# Patient Record
Sex: Male | Born: 1990 | Race: White | Hispanic: No | Marital: Married | Smoking: Current every day smoker
Health system: Southern US, Community
[De-identification: ages and names within clinical notes are randomized; demographics above are authoritative.]

---
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
## 2009-04-18 ENCOUNTER — Emergency Department (HOSPITAL_COMMUNITY): Admission: EM | Admit: 2009-04-18 | Discharge: 2009-04-18 | Payer: Self-pay | Admitting: Emergency Medicine

## 2013-12-30 ENCOUNTER — Emergency Department (HOSPITAL_COMMUNITY)
Admission: EM | Admit: 2013-12-30 | Discharge: 2013-12-30 | Disposition: A | Discharge status: Home / Self Care | Payer: Self-pay | Attending: Emergency Medicine | Admitting: Emergency Medicine

## 2013-12-30 ENCOUNTER — Encounter (HOSPITAL_COMMUNITY): Payer: Self-pay | Admitting: Emergency Medicine

## 2013-12-30 DIAGNOSIS — B029 Zoster without complications: Secondary | ICD-10-CM | POA: Insufficient documentation

## 2013-12-30 DIAGNOSIS — F17200 Nicotine dependence, unspecified, uncomplicated: Secondary | ICD-10-CM | POA: Insufficient documentation

## 2013-12-30 MED ORDER — HYDROCODONE-ACETAMINOPHEN 5-325 MG PO TABS: 1.0000 | ORAL_TABLET | Freq: Four times a day (QID) | ORAL | Status: AC | PRN

## 2013-12-30 NOTE — ED Notes (Signed)
The pt has a rash to the  Lt  Lower ribs anterior lateral.  The past 2 days the rash has become more painful.  He describes as a burning sensation

## 2013-12-30 NOTE — Discharge Instructions (Signed)
Shingles  Shingles is caused by the same virus that causes chickenpox. The first feelings may be pain or tingling. A rash will follow in a couple days. The rash may occur on any area of the body. Long-lasting pain is more likely in an elderly person. It can last months to years. There are medicines that can help prevent pain if you start taking them early.  HOME CARE    Place cool cloths on the rash.   Only take medicine as told by your doctor.   You may use calamine lotion to relieve itchy skin.   Avoid touching:   Babies.   Children with inflamed skin (eczema).   People who have gotten transplanted organs.   People with chronic illnesses, such as leukemia and AIDS.   If the rash is on the face, you may need to see a specialist. Keep all appointments. Shingles must be kept away from the eyes, if possible.   Keep all appointments.   Avoid touching the eyes or eye area, if possible.  GET HELP RIGHT AWAY IF:    You have any pain on the face or eye.   Your medicines do not help.   Your redness or puffiness (swelling) spreads.   You have a fever.   You notice any red lines going away from the rash area.  MAKE SURE YOU:    Understand these instructions.   Will watch your condition.   Will get help right away if you are not doing well or get worse.  Document Released: 03/26/2008 Document Revised: 12/31/2011 Document Reviewed: 03/26/2008  [employer] Patient Information 2014 [employer].

## 2013-12-30 NOTE — ED Notes (Signed)
PA at bedside.

## 2013-12-30 NOTE — ED Provider Notes (Signed)
CSN: [id number]     Arrival date & time 12/30/13  1836 History  This chart was scribed for non-physician practitioner, Rudene Anda, PA-C working with Nelia Shi, MD by Greggory Stallion, ED scribe. This patient was seen in room TR09C/TR09C and the patient's care was started at 9:40 PM.   Chief Complaint  Patient presents with  . Herpes Zoster   The history is provided by the patient. No language interpreter was used.   HPI Comments: Steven Morgan is a 23 y.o. male who presents to the Emergency Department complaining of a burning rash to his left flank area that started 1 wk ago. He states it has become more painful. Rates the pain 5/10. Laying on his side worsens the pain. He has used calamine lotion with little relief. Denies fever, chills. Pt thinks he had chicken pox when he was younger. Denies any spreading of rash, no fever/chills, n/v, HA, dizziness.   History reviewed. No pertinent past medical history. History reviewed. No pertinent past surgical history. No family history on file. History  Substance Use Topics  . Smoking status: Current Every Day Smoker  . Smokeless tobacco: Not on file  . Alcohol Use: Yes    Review of Systems  Constitutional: Negative for fever and chills.  All other systems reviewed and are negative.   Allergies  Review of patient's allergies indicates no known allergies.  Home Medications   Current Outpatient Rx  Name  Route  Sig  Dispense  Refill  . HYDROcodone-acetaminophen (NORCO) 5-325 MG per tablet   Oral   Take 1-2 tablets by mouth every 6 (six) hours as needed for moderate pain.   15 tablet   0     BP 127/76  Pulse 84  Temp(Src) 98.8 F (37.1 C) (Oral)  Resp 18  Ht 5\' 8"  (1.727 m)  Wt 137 lb (62.143 kg)  BMI 20.84 kg/m2  SpO2 97%  Physical Exam  Nursing note and vitals reviewed. Constitutional: He is oriented to person, place, and time. He appears well-developed and well-nourished. No distress.  HENT:  Head:  Normocephalic and atraumatic.  Eyes: Conjunctivae are normal.  Neck: No JVD present. No tracheal deviation present.  Cardiovascular: Normal rate and regular rhythm.  Exam reveals no gallop and no friction rub.   No murmur heard. Pulmonary/Chest: Effort normal. No respiratory distress. He has no wheezes. He has no rhonchi. He has no rales.  Musculoskeletal: Normal range of motion. He exhibits no edema.  Neurological: He is alert and oriented to person, place, and time.  Skin: Skin is warm and dry. He is not diaphoretic.     Unilateral erythematous patches with black crusting. No extending redness. Tenderness to palpation. Dermatomal distribution as depicted above in diagram. Does not cross midline. Suggestive of herpes zoster. No evidence of any new vesicular lesions. Appears to be resolving.   Psychiatric: He has a normal mood and affect. His behavior is normal.    ED Course  Procedures (including critical care time)  DIAGNOSTIC STUDIES: Oxygen Saturation is 97% on RA, normal by my interpretation.    COORDINATION OF CARE: 9:44 PM-Discussed treatment plan which includes pain medication with pt at bedside and pt agreed to plan.   Labs Review Labs Reviewed - No data to display Imaging Review No results found.   EKG Interpretation None      MDM   Final diagnoses:  Herpes zoster   Patient afebrile with normal VS. Patient in NAD.  Discussed exam findings  and pathology of shingles. Discussed treatment plan with analgesics and follow up if symptoms worsen or rash spreads. Patient is immunecompetent. Rash present > 72 hours, no indication for antivirals at this time. Discussed this with patient. Patient confirms understanding and agrees with current treatment plan for analgesics and follow up for any worsening symptoms.   Meds given in ED:  Discharge Meds:  . HYDROcodone-acetaminophen (NORCO) 5-325 MG per tablet   Oral   Take 1-2 tablets by mouth every 6 (six) hours as needed  for moderate pain.   15 tablet      I personally performed the services described in this documentation, which was scribed in my presence. The recorded information has been reviewed and is accurate.  Rudene AndaJacob Gray Amyah Clawson, PA-C 01/01/14 [phone]

## 2014-01-11 NOTE — ED Provider Notes (Signed)
Medical screening examination/treatment/procedure(s) were performed by non-physician practitioner and as supervising physician I was immediately available for consultation/collaboration.   Madilynn Montante L Kedra Mcglade, MD 01/11/14 1116 

## 2017-04-15 ENCOUNTER — Emergency Department (HOSPITAL_COMMUNITY)
Admission: EM | Admit: 2017-04-15 | Discharge: 2017-04-16 | Disposition: A | Discharge status: Home / Self Care | Payer: Self-pay | Attending: Emergency Medicine | Admitting: Emergency Medicine

## 2017-04-15 ENCOUNTER — Encounter (HOSPITAL_COMMUNITY): Payer: Self-pay | Admitting: Family Medicine

## 2017-04-15 DIAGNOSIS — F17210 Nicotine dependence, cigarettes, uncomplicated: Secondary | ICD-10-CM | POA: Insufficient documentation

## 2017-04-15 DIAGNOSIS — R1030 Lower abdominal pain, unspecified: Secondary | ICD-10-CM | POA: Insufficient documentation

## 2017-04-15 LAB — URINALYSIS, ROUTINE W REFLEX MICROSCOPIC
Bilirubin Urine: NEGATIVE
Glucose, UA: NEGATIVE mg/dL
HGB URINE DIPSTICK: NEGATIVE
Ketones, ur: NEGATIVE mg/dL
Leukocytes, UA: NEGATIVE
Nitrite: NEGATIVE
Protein, ur: NEGATIVE mg/dL
SPECIFIC GRAVITY, URINE: 1.018 (ref 1.005–1.030)
pH: 7 (ref 5.0–8.0)

## 2017-04-15 LAB — COMPREHENSIVE METABOLIC PANEL
ALT: 23 U/L (ref 17–63)
ANION GAP: 5 (ref 5–15)
AST: 24 U/L (ref 15–41)
Albumin: 4.7 g/dL (ref 3.5–5.0)
Alkaline Phosphatase: 96 U/L (ref 38–126)
BUN: 13 mg/dL (ref 6–20)
CHLORIDE: 105 mmol/L (ref 101–111)
CO2: 27 mmol/L (ref 22–32)
Calcium: 9 mg/dL (ref 8.9–10.3)
Creatinine, Ser: 0.84 mg/dL (ref 0.61–1.24)
GFR calc non Af Amer: >60 mL/min (ref >60)
Glucose, Bld: 126 mg/dL — ABNORMAL HIGH (ref 65–99)
Potassium: 4.3 mmol/L (ref 3.5–5.1)
Sodium: 137 mmol/L (ref 135–145)
Total Bilirubin: 0.4 mg/dL (ref 0.3–1.2)
Total Protein: 7.3 g/dL (ref 6.5–8.1)

## 2017-04-15 LAB — CBC
HCT: 42.3 % (ref 39.0–52.0)
Hemoglobin: 14.8 g/dL (ref 13.0–17.0)
MCH: 32.5 pg (ref 26.0–34.0)
MCHC: 35 g/dL (ref 30.0–36.0)
MCV: 92.8 fL (ref 78.0–100.0)
Platelets: 283 10*3/uL (ref 150–400)
RBC: 4.56 MIL/uL (ref 4.22–5.81)
RDW: 13.3 % (ref 11.5–15.5)
WBC: 10.2 10*3/uL (ref 4.0–10.5)

## 2017-04-15 LAB — LIPASE, BLOOD: LIPASE: 19 U/L (ref 11–51)

## 2017-04-15 NOTE — ED Provider Notes (Signed)
[facility]-EMERGENCY DEPT Provider Note   CSN: [id number] Arrival date & time: 04/15/17  1733  By signing my name below, I, Phillips ClimesFabiola de Louis, attest that this documentation has been prepared under the direction and in the presence of Romel Dumond, Canary BrimChristopher J, * . Electronically Signed: Phillips ClimesFabiola de Louis, Scribe. 04/16/2017. 1:56 AM.  History   Chief Complaint Chief Complaint  Patient presents with  . Abdominal Pain   HPI Comments Steven Morgan is a 26 y.o. male with a reported PMHx of kidney stones, who presents to the Emergency Department with complaints of lower back and abdominal pain x1 yr, worse in the last day.  Pt feels as if he has a kidney stone.  Pain is worse when pt drinks fluids, currently rated a 4/10 in severity.  Reports small black granules in his urine.  Some dysuria.  Increase in urgency, frequency.  Low urine output.  Pt denies experiencing any other acute sx.  The history is provided by the patient. No language interpreter was used.    History reviewed. No pertinent past medical history.  There are no active problems to display for this patient.   History reviewed. No pertinent surgical history.     Home Medications    Prior to Admission medications   Medication Sig Start Date End Date Taking? Authorizing Provider  HYDROcodone-acetaminophen (NORCO) 5-325 MG per tablet Take 1-2 tablets by mouth every 6 (six) hours as needed for moderate pain. Patient not taking: Reported on 04/15/2017 12/30/13   Cristobal GoldmannLackey, Jacob, PA-C    Family History History reviewed. No pertinent family history.  Social History Social History  Substance Use Topics  . Smoking status: Current Every Day Smoker    Packs/day: 1.00    Types: Cigarettes  . Smokeless tobacco: Never Used  . Alcohol use No     Allergies   Patient has no known allergies.   Review of Systems Review of Systems  Constitutional: Negative for chills, fatigue, fever and unexpected weight change.  Respiratory:  Negative for shortness of breath.   Cardiovascular: Negative for chest pain.  Gastrointestinal: Positive for abdominal pain. Negative for nausea and vomiting.  Genitourinary: Positive for decreased urine volume, dysuria, frequency and urgency.  Musculoskeletal: Positive for back pain.  Neurological: Negative for weakness and numbness.  All other systems reviewed and are negative.  Physical Exam Updated Vital Signs BP 113/75 (BP Location: Right Arm)   Pulse 73   Temp 97.6 F (36.4 C) (Oral)   Resp 20   Ht 5\' 6"  (1.676 m)   Wt 59 kg (130 lb)   SpO2 98%   BMI 20.98 kg/m   Physical Exam  Constitutional: He is oriented to person, place, and time. He appears well-developed and well-nourished. No distress.  HENT:  Head: Normocephalic and atraumatic.  Right Ear: Hearing normal.  Left Ear: Hearing normal.  Nose: Nose normal.  Mouth/Throat: Oropharynx is clear and moist and mucous membranes are normal.  Eyes: Conjunctivae and EOM are normal. Pupils are equal, round, and reactive to light.  Neck: Normal range of motion. Neck supple.  Cardiovascular: Regular rhythm, S1 normal and S2 normal.  Exam reveals no gallop and no friction rub.   No murmur heard. Pulmonary/Chest: Effort normal and breath sounds normal. No respiratory distress. He exhibits no tenderness.  Abdominal: Soft. Normal appearance and bowel sounds are normal. There is no hepatosplenomegaly. There is no rebound, no guarding, no tenderness at McBurney's point and negative Murphy's sign. No hernia.  Suprapubic tenderness, no grading  or rebound.   Musculoskeletal: Normal range of motion.  Neurological: He is alert and oriented to person, place, and time. He has normal strength. No cranial nerve deficit or sensory deficit. Coordination normal. GCS eye subscore is 4. GCS verbal subscore is 5. GCS motor subscore is 6.  Skin: Skin is warm, dry and intact. No rash noted. No cyanosis.  Psychiatric: He has a normal mood and affect. His  speech is normal and behavior is normal. Thought content normal.  Nursing note and vitals reviewed.  ED Treatments / Results  DIAGNOSTIC STUDIES: Oxygen Saturation is 99% on room air, normal by my interpretation.    COORDINATION OF CARE: 11:25 PM Discussed treatment plan with pt at bedside and pt agreed to plan.  Labs (all labs ordered are listed, but only abnormal results are displayed) Labs Reviewed  COMPREHENSIVE METABOLIC PANEL - Abnormal; Notable for the following:       Result Value   Glucose, Bld 126 (*)    All other components within normal limits  URINALYSIS, ROUTINE W REFLEX MICROSCOPIC - Abnormal; Notable for the following:    APPearance HAZY (*)    All other components within normal limits  LIPASE, BLOOD  CBC    EKG  EKG Interpretation None       Radiology Ct Renal Stone Study  Result Date: 04/16/2017 CLINICAL DATA:  Abdominal pain and low back pain for 1 year, worsened over the past day. EXAM: CT ABDOMEN AND PELVIS WITHOUT CONTRAST TECHNIQUE: Multidetector CT imaging of the abdomen and pelvis was performed following the standard protocol without IV contrast. COMPARISON:  None. FINDINGS: Lower chest: No acute abnormality. Hepatobiliary: No focal liver abnormality is seen. No gallstones, gallbladder wall thickening, or biliary dilatation. Pancreas: Unremarkable. No pancreatic ductal dilatation or surrounding inflammatory changes. Spleen: Normal in size without focal abnormality. Adrenals/Urinary Tract: Adrenal glands are unremarkable. Kidneys are normal, without renal calculi, focal lesion, or hydronephrosis. Bladder is unremarkable. Stomach/Bowel: Stomach is within normal limits. Appendix is normal. No evidence of bowel wall thickening, distention, or inflammatory changes. Vascular/Lymphatic: No significant vascular findings are present. No enlarged abdominal or pelvic lymph nodes. Reproductive: Unremarkable Other: No focal inflammation.  No ascites. Musculoskeletal: No  significant skeletal lesion IMPRESSION: No significant abnormality. Electronically Signed   By: Ellery Plunk M.D.   On: 04/16/2017 01:13    Procedures Procedures (including critical care time)  Medications Ordered in ED Medications - No data to display   Initial Impression / Assessment and Plan / ED Course  I have reviewed the triage vital signs and the nursing notes.  Pertinent labs & imaging results that were available during my care of the patient were reviewed by me and considered in my medical decision making (see chart for details).     Patient presents to the emergency department for evaluation of back pain and lower abdominal pain. He reports that symptoms have been ongoing for approximately a year. It seems that he has been dealing with these pains on and off. In the last few days he has noticed what he thought was bilateral kidney pain, decreased urine output with sediment in his urine. He describes pain in the suprapubic area and there was mild tenderness but no evidence of guarding or rebound. He was concerned about kidney stone. Blood work was normal. Urinalysis was unremarkable. CT scan performed, no evidence of any pathology including no evidence of bladder outlet obstruction, and no ureterolithiasis, no infectious or inflammatory changes.  I personally performed the services described  in this documentation, which was scribed in my presence. The recorded information has been reviewed and is accurate.  Final Clinical Impressions(s) / ED Diagnoses   Final diagnoses:  Lower abdominal pain    New Prescriptions New Prescriptions   No medications on file     Gilda Crease, MD 04/16/17 [phone]

## 2017-04-15 NOTE — ED Triage Notes (Signed)
Patient reports he has lower back and lower abd pain for a year but today is his breaking point. Pt reports he has sediments in his urine since Saturday night. Denies any fever. Reports intermittently burning with urinating.

## 2017-04-16 ENCOUNTER — Emergency Department (HOSPITAL_COMMUNITY): Payer: Self-pay

## 2017-04-16 MED ORDER — DICYCLOMINE HCL 20 MG PO TABS: 20.0000 mg | ORAL_TABLET | Freq: Two times a day (BID) | ORAL | 0 refills | Status: AC

## 2018-03-13 IMAGING — CT CT RENAL STONE PROTOCOL
2 of 3 series · 16 of 46 positions shown, 18 images · non-contrast
Comparison: None.

CLINICAL DATA: Abdominal pain and low back pain for 1 year,
worsened over the past day.

EXAM:
CT ABDOMEN AND PELVIS WITHOUT CONTRAST
TECHNIQUE: Multidetector CT imaging of the abdomen and pelvis was performed
following the standard protocol without IV contrast.

[Series 3: lung · axial · 0.65mm/px · z∈[+1500,+1558]mm · 13 of 35 slices shown, 15 images]
[im 3/35  soft-tissue]
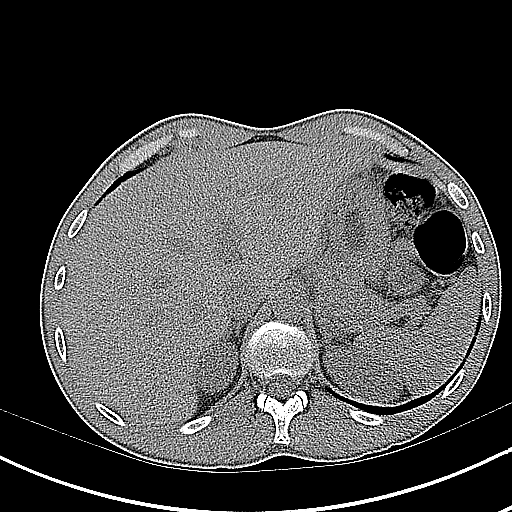
[im 3/35  bone]
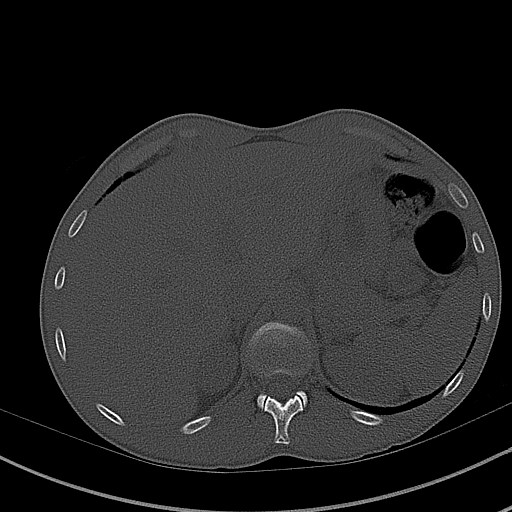
[im 5/35  soft-tissue]
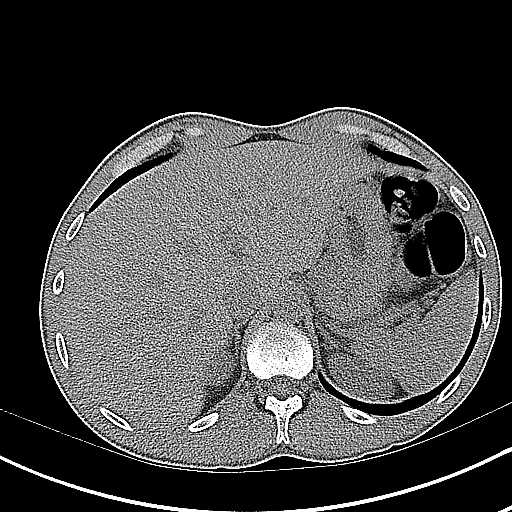
[im 7/35  soft-tissue]
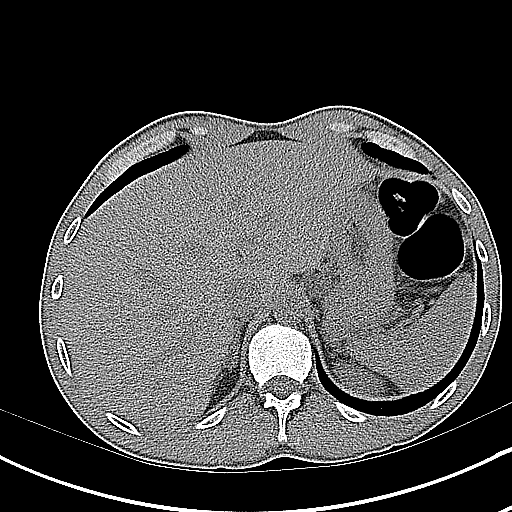
[im 10/35  soft-tissue]
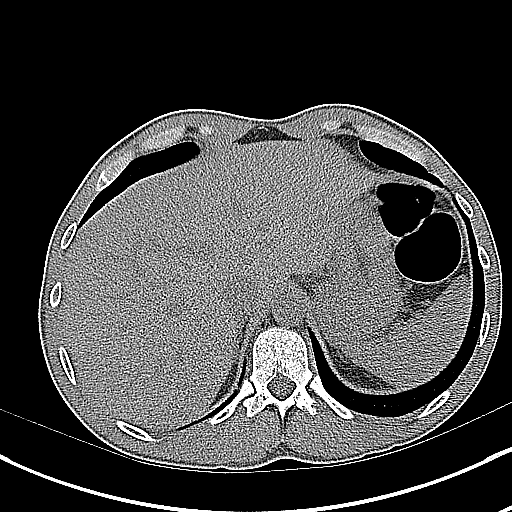
[im 13/35  soft-tissue]
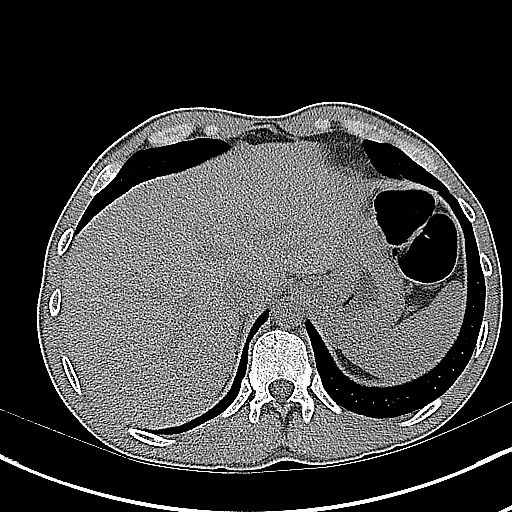
[im 15/35  soft-tissue]
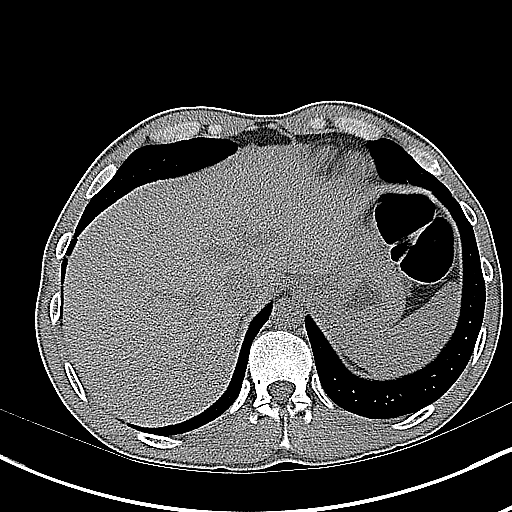
[im 18/35  soft-tissue]
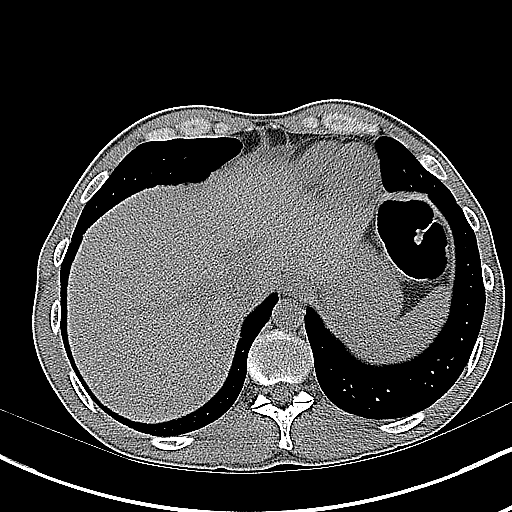
[im 20/35  soft-tissue]
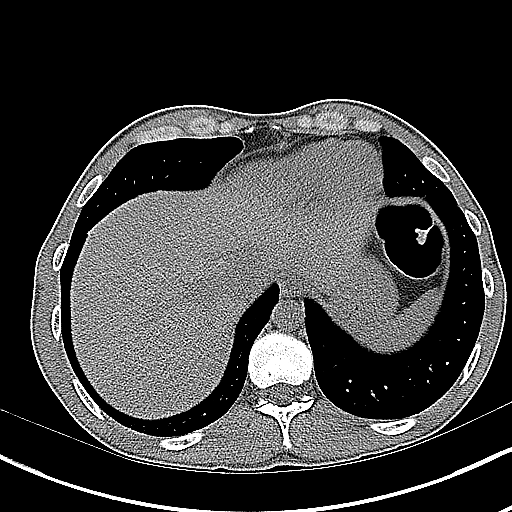
[im 22/35  soft-tissue]
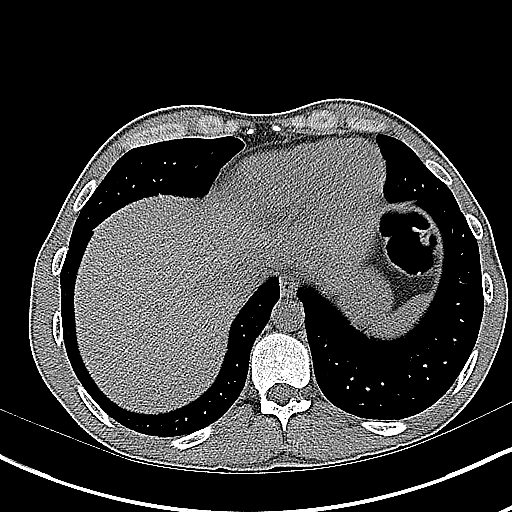
[im 22/35  bone]
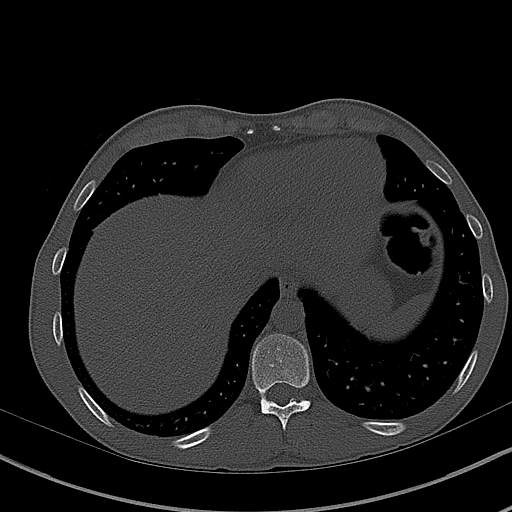
[im 25/35  soft-tissue]
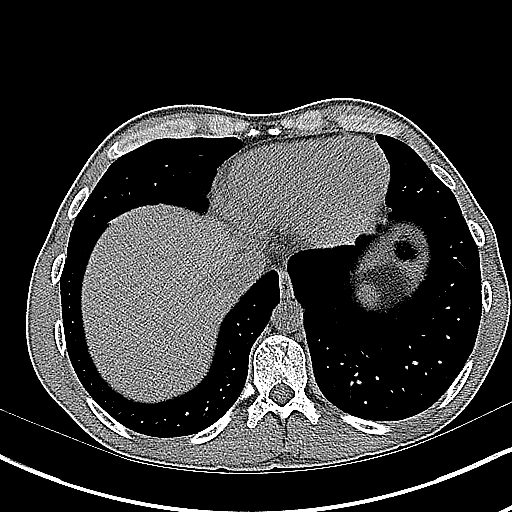
[im 28/35  soft-tissue]
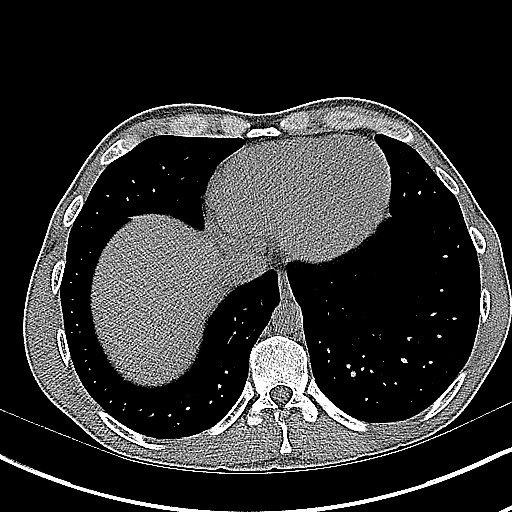
[im 30/35  soft-tissue]
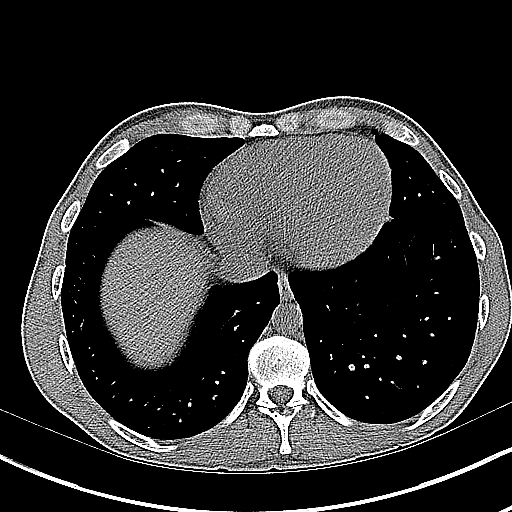
[im 32/35  soft-tissue]
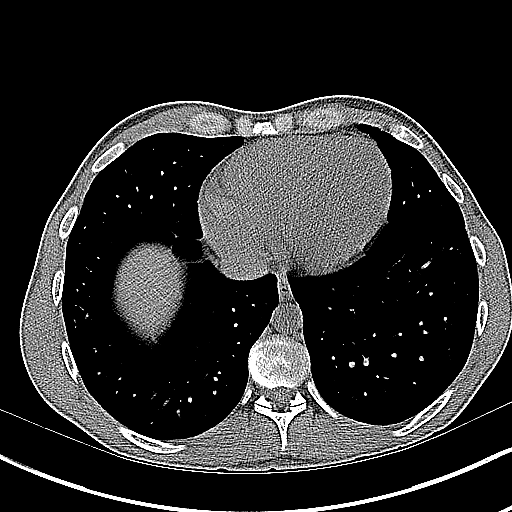

[Series 4: coronal · coronal · 0.62mm/px · 3 of 112 slices shown]
[im 38/112  soft-tissue]
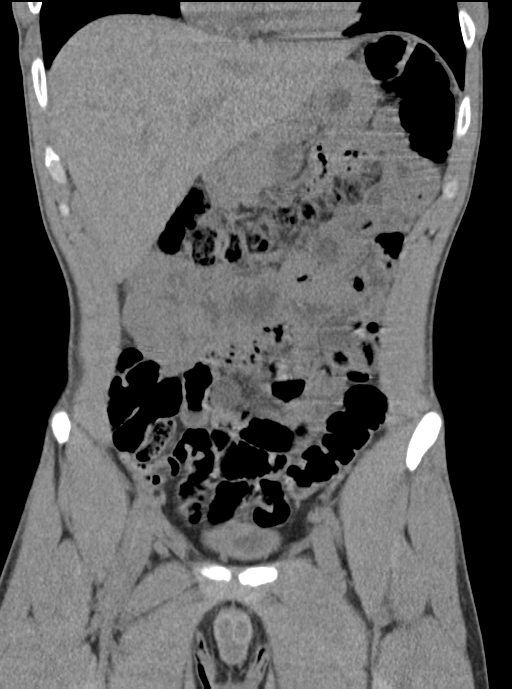
[im 50/112  soft-tissue]
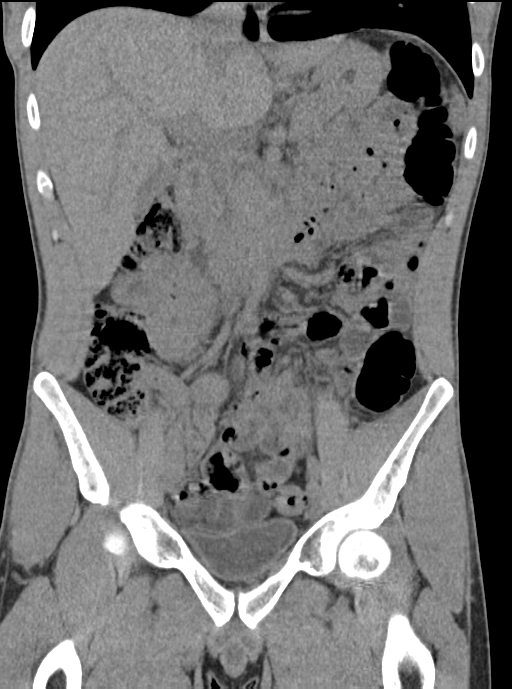
[im 62/112  soft-tissue]
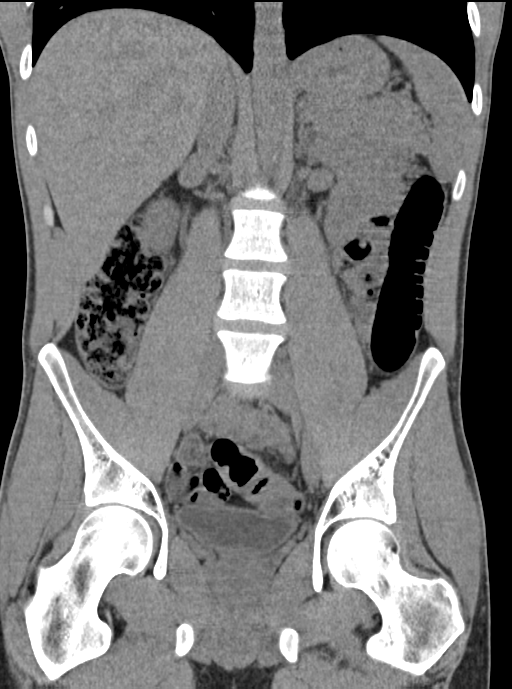

[16 of 46 positions shown; findings below may reference images not displayed]

FINDINGS: Lower chest: No acute abnormality.

Hepatobiliary: No focal liver abnormality is seen. No gallstones,
gallbladder wall thickening, or biliary dilatation.

Pancreas: Unremarkable. No pancreatic ductal dilatation or
surrounding inflammatory changes.

Spleen: Normal in size without focal abnormality.

Adrenals/Urinary Tract: Adrenal glands are unremarkable. Kidneys are
normal, without renal calculi, focal lesion, or hydronephrosis.
Bladder is unremarkable.

Stomach/Bowel: Stomach is within normal limits. Appendix is normal.
No evidence of bowel wall thickening, distention, or inflammatory
changes.

Vascular/Lymphatic: No significant vascular findings are present. No
enlarged abdominal or pelvic lymph nodes.

Reproductive: Unremarkable

Other: No focal inflammation.  No ascites.

Musculoskeletal: No significant skeletal lesion
IMPRESSION: No significant abnormality.

## 2024-08-15 ENCOUNTER — Emergency Department (HOSPITAL_COMMUNITY): Payer: Self-pay

## 2024-08-15 ENCOUNTER — Emergency Department (HOSPITAL_COMMUNITY)
Admission: EM | Admit: 2024-08-15 | Discharge: 2024-08-15 | Disposition: A | Discharge status: Home / Self Care | Payer: Self-pay | Attending: Emergency Medicine | Admitting: Emergency Medicine

## 2024-08-15 ENCOUNTER — Other Ambulatory Visit: Payer: Self-pay

## 2024-08-15 ENCOUNTER — Encounter (HOSPITAL_COMMUNITY): Payer: Self-pay

## 2024-08-15 DIAGNOSIS — S0003XA Contusion of scalp, initial encounter: Secondary | ICD-10-CM

## 2024-08-15 DIAGNOSIS — H1131 Conjunctival hemorrhage, right eye: Secondary | ICD-10-CM

## 2024-08-15 DIAGNOSIS — S0083XA Contusion of other part of head, initial encounter: Secondary | ICD-10-CM | POA: Insufficient documentation

## 2024-08-15 DIAGNOSIS — M542 Cervicalgia: Secondary | ICD-10-CM | POA: Insufficient documentation

## 2024-08-15 DIAGNOSIS — Y9355 Activity, bike riding: Secondary | ICD-10-CM | POA: Insufficient documentation

## 2024-08-15 LAB — I-STAT CHEM 8, ED
BUN: 15 mg/dL (ref 6–20)
Calcium, Ion: 1.14 mmol/L — ABNORMAL LOW (ref 1.15–1.40)
Chloride: 102 mmol/L (ref 98–111)
Creatinine, Ser: 1.1 mg/dL (ref 0.61–1.24)
Glucose, Bld: 111 mg/dL — ABNORMAL HIGH (ref 70–99)
HCT: 45 % (ref 39.0–52.0)
Hemoglobin: 15.3 g/dL (ref 13.0–17.0)
Potassium: 6.7 mmol/L (ref 3.5–5.1)
Sodium: 136 mmol/L (ref 135–145)
TCO2: 27 mmol/L (ref 22–32)

## 2024-08-15 LAB — COMPREHENSIVE METABOLIC PANEL WITH GFR
ALT: 33 U/L (ref 0–44)
AST: 40 U/L (ref 15–41)
Albumin: 4.3 g/dL (ref 3.5–5.0)
Alkaline Phosphatase: 78 U/L (ref 38–126)
Anion gap: 12 (ref 5–15)
BUN: 10 mg/dL (ref 6–20)
CO2: 21 mmol/L — ABNORMAL LOW (ref 22–32)
Calcium: 9.5 mg/dL (ref 8.9–10.3)
Chloride: 104 mmol/L (ref 98–111)
Creatinine, Ser: 0.85 mg/dL (ref 0.61–1.24)
GFR, Estimated: >60 mL/min (ref >60)
Glucose, Bld: 86 mg/dL (ref 70–99)
Potassium: 4.4 mmol/L (ref 3.5–5.1)
Sodium: 137 mmol/L (ref 135–145)
Total Bilirubin: 1.7 mg/dL — ABNORMAL HIGH (ref 0.0–1.2)
Total Protein: 6.6 g/dL (ref 6.5–8.1)

## 2024-08-15 LAB — CBC
HCT: 42.8 % (ref 39.0–52.0)
Hemoglobin: 15 g/dL (ref 13.0–17.0)
MCH: 32.5 pg (ref 26.0–34.0)
MCHC: 35 g/dL (ref 30.0–36.0)
MCV: 92.8 fL (ref 80.0–100.0)
Platelets: 331 K/uL (ref 150–400)
RBC: 4.61 MIL/uL (ref 4.22–5.81)
RDW: 12.9 % (ref 11.5–15.5)
WBC: 12.6 K/uL — ABNORMAL HIGH (ref 4.0–10.5)
nRBC: 0 % (ref 0.0–0.2)

## 2024-08-15 LAB — I-STAT CG4 LACTIC ACID, ED: Lactic Acid, Venous: 1.6 mmol/L (ref 0.5–1.9)

## 2024-08-15 LAB — PROTIME-INR
INR: 0.9 (ref 0.8–1.2)
Prothrombin Time: 12.8 s (ref 11.4–15.2)

## 2024-08-15 NOTE — ED Notes (Signed)
 C-collar applied in triage.

## 2024-08-15 NOTE — Discharge Instructions (Signed)
 Potassium is normal.  CT workup negative as we discussed before.  Expect the bruising to take a long time to resolve.  Work note provided.  Would rest at home for the next few days.  Return for any new or worse symptoms.
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
